# Patient Record
Sex: Female | Born: 1960 | Hispanic: Yes | Marital: Single | Smoking: Current every day smoker
Health system: Southern US, Community
[De-identification: ages and names within clinical notes are randomized; demographics above are authoritative.]

## PROBLEM LIST (undated history)

## (undated) DIAGNOSIS — Z9889 Other specified postprocedural states: Secondary | ICD-10-CM

## (undated) DIAGNOSIS — I10 Essential (primary) hypertension: Secondary | ICD-10-CM

## (undated) HISTORY — PX: BREAST SURGERY: SHX581

## (undated) HISTORY — PX: BREAST CYST EXCISION: SHX579

## (undated) HISTORY — DX: Essential (primary) hypertension: I10

## (undated) HISTORY — DX: Other specified postprocedural states: Z98.890

---

## 2018-12-14 ENCOUNTER — Encounter: Payer: Self-pay | Admitting: *Deleted

## 2018-12-31 ENCOUNTER — Ambulatory Visit (INDEPENDENT_AMBULATORY_CARE_PROVIDER_SITE_OTHER): Payer: Self-pay | Admitting: Obstetrics and Gynecology

## 2018-12-31 ENCOUNTER — Encounter: Payer: Self-pay | Admitting: Obstetrics and Gynecology

## 2018-12-31 VITALS — BP 175/109 | HR 72 | Ht 63.0 in | Wt 177.4 lb

## 2018-12-31 DIAGNOSIS — L299 Pruritus, unspecified: Secondary | ICD-10-CM | POA: Insufficient documentation

## 2018-12-31 DIAGNOSIS — Z113 Encounter for screening for infections with a predominantly sexual mode of transmission: Secondary | ICD-10-CM

## 2018-12-31 DIAGNOSIS — Z01419 Encounter for gynecological examination (general) (routine) without abnormal findings: Secondary | ICD-10-CM

## 2018-12-31 DIAGNOSIS — Z1239 Encounter for other screening for malignant neoplasm of breast: Secondary | ICD-10-CM

## 2018-12-31 DIAGNOSIS — N3020 Other chronic cystitis without hematuria: Secondary | ICD-10-CM

## 2018-12-31 DIAGNOSIS — Z1151 Encounter for screening for human papillomavirus (HPV): Secondary | ICD-10-CM

## 2018-12-31 DIAGNOSIS — R102 Pelvic and perineal pain: Secondary | ICD-10-CM

## 2018-12-31 DIAGNOSIS — Z124 Encounter for screening for malignant neoplasm of cervix: Secondary | ICD-10-CM

## 2018-12-31 LAB — POCT URINALYSIS DIP (DEVICE)
Bilirubin Urine: NEGATIVE
Glucose, UA: NEGATIVE mg/dL
Hgb urine dipstick: NEGATIVE
Ketones, ur: NEGATIVE mg/dL
Leukocytes, UA: NEGATIVE
Nitrite: NEGATIVE
PROTEIN: NEGATIVE mg/dL
Specific Gravity, Urine: 1.025 (ref 1.005–1.030)
Urobilinogen, UA: 0.2 mg/dL (ref 0.0–1.0)
pH: 5.5 (ref 5.0–8.0)

## 2018-12-31 MED ORDER — NITROFURANTOIN MONOHYD MACRO 100 MG PO CAPS: 100.0000 mg | ORAL_CAPSULE | Freq: Two times a day (BID) | ORAL | 0 refills | Status: DC

## 2018-12-31 NOTE — Progress Notes (Signed)
Pt states she feels a crawling sensation on her bottom & having clear d/c. And has been having bad headaches.

## 2018-12-31 NOTE — Addendum Note (Signed)
Addended by: Nettie Elm L on: 12/31/2018 05:00 PM   Modules accepted: Orders

## 2018-12-31 NOTE — Progress Notes (Signed)
Patient ID: Autumn Villanueva, female   DOB: 1961-12-23, 58 y.o.   MRN: [id number] Autumn Villanueva presents with multiple complaints. She reports external itching for the last 1-2 months Has seen PCP and been treated with various medications without relief. Has also been treated for several UTI's. Still reports some lower abd/pelvic cramps She denies any bladder dysfunction but has problems with constipation. Not sexual active. Last pap unknown HTN ( did not take meds today) Last mammogram 1 yr ago, normal per pt TSVD x 2  PE AF BP as noted Lungs clear Heart RRR Abd soft + BS GU nl EGBUS cervix no lesion bladder + tenderness uterus small mobile no masses or tenderness  A/P Yearly Gyn exam        Pelvic pain        Chronic cystitis        Pruritis  Pap smear completed. Screening mammogram ordered. Will check UA and Urine culture. Check GYN U/S. Suspect pt's pain related to bladder, will treat with Macrobid. A & D onit for pruritis. F/U in 6 weeks

## 2019-01-05 ENCOUNTER — Telehealth: Payer: Self-pay

## 2019-01-05 NOTE — Telephone Encounter (Signed)
Called pt using [employer] id# [phone] advise pt of [place] Appt on 1/13 at 11am & to arrive at 10:45 w/ full bladder. No answer.no VM

## 2019-01-06 ENCOUNTER — Telehealth: Payer: Self-pay

## 2019-01-06 LAB — CYTOLOGY - PAP
Adequacy: ABSENT
CHLAMYDIA, DNA PROBE: NEGATIVE
Diagnosis: NEGATIVE
HPV 16/18/45 genotyping: NEGATIVE
HPV: DETECTED — AB
Neisseria Gonorrhea: NEGATIVE

## 2019-01-06 NOTE — Telephone Encounter (Signed)
Called pt again using [address] Jerrel Ivory id# [phone], regarding [place] appt on 01/11/19, no answer,  VM not set up. Will send certified letter of appointment. My Chart not setup

## 2019-01-11 ENCOUNTER — Ambulatory Visit (HOSPITAL_COMMUNITY)
Admission: RE | Admit: 2019-01-11 | Discharge: 2019-01-11 | Disposition: A | Discharge status: Home / Self Care | Payer: Self-pay | Source: Ambulatory Visit | Attending: Obstetrics and Gynecology | Admitting: Obstetrics and Gynecology

## 2019-01-11 ENCOUNTER — Telehealth: Payer: Self-pay

## 2019-01-11 DIAGNOSIS — R102 Pelvic and perineal pain: Secondary | ICD-10-CM | POA: Insufficient documentation

## 2019-01-11 NOTE — Telephone Encounter (Signed)
Called pt to give [place] results & to make sure she keeps her f/u appt on 02/11/19 @ 9:15am. Used Spanish [employer] [place] id# [phone] to relay message. Pt verbalized understanding.

## 2019-01-11 NOTE — Telephone Encounter (Signed)
-----   Message from Hermina Staggers, MD sent at 01/11/2019  3:35 PM EST ----- Please let pt know that her GYN U/S was normal Continue and complete antibiotics Keep F/U appt with me in 6 weeks Thanks Casimiro Needle

## 2019-02-11 ENCOUNTER — Ambulatory Visit (INDEPENDENT_AMBULATORY_CARE_PROVIDER_SITE_OTHER): Payer: Self-pay | Admitting: Obstetrics and Gynecology

## 2019-02-11 ENCOUNTER — Encounter: Payer: Self-pay | Admitting: Obstetrics and Gynecology

## 2019-02-11 VITALS — BP 135/90 | HR 68 | Ht 63.0 in | Wt 180.6 lb

## 2019-02-11 DIAGNOSIS — N760 Acute vaginitis: Secondary | ICD-10-CM

## 2019-02-11 DIAGNOSIS — L299 Pruritus, unspecified: Secondary | ICD-10-CM

## 2019-02-11 MED ORDER — GABAPENTIN 300 MG PO CAPS: 300.0000 mg | ORAL_CAPSULE | Freq: Three times a day (TID) | ORAL | 1 refills | Status: DC

## 2019-02-11 MED ORDER — ESTRADIOL 0.1 MG/GM VA CREA: 2.0000 g | TOPICAL_CREAM | Freq: Every day | VAGINAL | 5 refills | Status: DC

## 2019-02-11 NOTE — Patient Instructions (Signed)
Virus del papiloma humano  Human Papillomavirus  El virus del papiloma humano (VPH) es la infeccin de transmisin sexual (ITS) ms frecuente. Se transmite fcilmente de persona a persona (es contagiosa). El VPH puede causar verrugas genitales y algunos tipos de cncer. Las verrugas genitales pueden verse y sentirse. Adems, es posible tener lesiones similares a las verrugas en la garganta. Es posible que el VPH no presente ningn sntoma. Puede padecer el VPH por mucho tiempo y no saberlo. Puede transmitir VPH a otras personas sin saberlo.  Siga estas indicaciones en su casa:  Medicamentos   Tome los medicamentos de venta libre y los recetados solamente como se lo haya indicado el mdico. Esto incluye cremas para la comezn o la irritacin.   No trate las verrugas genitales con los medicamentos para las verrugas de las manos.  Cmo se evita?   Hable con el mdico sobre las vacunas del VPH. Los hombres y las mujeres entre 9 y 26aos deben vacunarse. Las vacunas no surtirn efecto si ya tiene VPH. Las embarazadas no deben vacunarse.   Despus del tratamiento, use preservativos cuando tenga relaciones sexuales. Esto ayuda en la prevencin de futuras infecciones.   Tenga solo una pareja sexual.   No tenga una pareja sexual que tenga otras parejas sexuales.   Asegrese de realizarse la prueba de Papanicolaou como se lo haya indicado el mdico.  Instrucciones generales   No se toque ni se rasque las verrugas.   No mantenga relaciones sexuales durante el tratamiento.   No se haga duchas vaginales ni use tampones durante el tratamiento.   Hable con sus pareja sexual sobre su infeccin. Es posible que su pareja tambin necesite tratamiento.   Si queda embarazada, informe a su mdico que tiene VPH. El mdico la controlar durante el embarazo.   Concurra a todas las visitas de control como se lo haya indicado el mdico. Esto es importante.  Comunquese con un mdico si:   La piel tratada se enrojece, se hincha  o duele.   Tiene fiebre.   Se siente enfermo.   Palpa bultos o granos en la zona genital o alrededor de esta.   Tiene una hemorragia vaginal.   Tiene hemorragia en la zona que recibi tratamiento.   Siente dolor durante las relaciones sexuales.  Resumen   El virus del papiloma humano (VPH) es la infeccin de transmisin sexual (ITS) ms frecuente. El virus se transmite fcilmente de una persona a otra.   Hable con el mdico sobre las vacunas del VPH. Los hombres y las mujeres entre 9 y 26aos deben vacunarse.   Es posible que el VPH no presente ningn sntoma.   Use cremas para la picazn o la irritacin solamente como se lo haya indicado el mdico.  Esta informacin no tiene como fin reemplazar el consejo del mdico. Asegrese de hacerle al mdico cualquier pregunta que tenga.  Document Released: 01/18/2011 Document Revised: 08/12/2017 Document Reviewed: 08/12/2017  Elsevier Interactive Patient Education  2019 Elsevier Inc.

## 2019-02-11 NOTE — Progress Notes (Signed)
Autumn Villanueva is here for follow up from last office visit. Reports that her pelvic pain has resolved. GYN U/S was unremarkable. Results reviewed with pt.  Pt is still having vaginal itching, irritation and pain with IC. She reports Sx seem to be aggravated with IC with or without condoms. She does have a new sexual partner and current problem started with new partner.  Pap smear from last visit normal except for HPV.  PE AF VSS Lungs clear Heart RRR Abd soft + BS GU nl EGBUS point tenderness vestibular area right > left Bladder non tender uterus small mobile non tender no masses  A/P Pelvic pain and chronic cystitis resolved        Pruritus/Vestibularitis I suspect pt problem is related to HPV and new sexual partner. HPV discussed with pt. Will try Estrace vaginal cream and start Gabapentin. Instructed to reframe from IC for the next 6 weeks F/U in 6 weeks. Live interrupter used during today's visit

## 2019-02-11 NOTE — Progress Notes (Signed)
Pt states still having the Vaginal itching & burning.And Inflamation

## 2019-02-25 ENCOUNTER — Telehealth: Payer: Self-pay | Admitting: Obstetrics and Gynecology

## 2019-02-25 DIAGNOSIS — N760 Acute vaginitis: Secondary | ICD-10-CM

## 2019-02-25 NOTE — Telephone Encounter (Signed)
Received a call from her husband about her Rx. He states she only received one, and should have gotten two. Requesting a call back from nurse with an interpreter for his wife.

## 2019-03-01 NOTE — Telephone Encounter (Signed)
Received a voice mail from 02/26/19 1:23pm from a female stating he called earlier about missing prescription- wants a call back.

## 2019-03-02 MED ORDER — ESTRADIOL 0.1 MG/GM VA CREA: 2.0000 g | TOPICAL_CREAM | Freq: Every day | VAGINAL | 5 refills | Status: DC

## 2019-03-02 NOTE — Addendum Note (Signed)
Addended by: Kathee Delton on: 03/02/2019 09:18 AM   Modules accepted: Orders

## 2019-03-02 NOTE — Telephone Encounter (Signed)
Called patient with Autumn Villanueva for interpreter and patient states she was supposed to have a Rx for a vaginal cream but it isn't at her pharmacy. Per chart review, med wasn't e-scribed. Reordered medication & informed patient. Patient verbalized understanding & states she is out of gabapentin Rx. Informed patient of refills at pharmacy. Patient verbalized understanding & had no other questions.

## 2019-03-04 ENCOUNTER — Ambulatory Visit: Payer: Self-pay | Admitting: Obstetrics and Gynecology

## 2019-03-29 ENCOUNTER — Ambulatory Visit (INDEPENDENT_AMBULATORY_CARE_PROVIDER_SITE_OTHER): Payer: Self-pay | Admitting: Obstetrics and Gynecology

## 2019-03-29 ENCOUNTER — Telehealth: Payer: Self-pay | Admitting: Obstetrics and Gynecology

## 2019-03-29 ENCOUNTER — Other Ambulatory Visit: Payer: Self-pay

## 2019-03-29 ENCOUNTER — Encounter: Payer: Self-pay | Admitting: Obstetrics and Gynecology

## 2019-03-29 DIAGNOSIS — N760 Acute vaginitis: Secondary | ICD-10-CM

## 2019-03-29 DIAGNOSIS — B009 Herpesviral infection, unspecified: Secondary | ICD-10-CM | POA: Insufficient documentation

## 2019-03-29 DIAGNOSIS — L299 Pruritus, unspecified: Secondary | ICD-10-CM

## 2019-03-29 MED ORDER — ESTRADIOL 0.1 MG/GM VA CREA: 2.0000 g | TOPICAL_CREAM | Freq: Every day | VAGINAL | 5 refills | Status: AC

## 2019-03-29 MED ORDER — VALACYCLOVIR HCL 1 G PO TABS: 1000.0000 mg | ORAL_TABLET | Freq: Two times a day (BID) | ORAL | 5 refills | Status: AC

## 2019-03-29 MED ORDER — GABAPENTIN 300 MG PO CAPS: 300.0000 mg | ORAL_CAPSULE | Freq: Three times a day (TID) | ORAL | 2 refills | Status: AC

## 2019-03-29 NOTE — Progress Notes (Signed)
Patient ID: Autumn Villanueva, female   DOB: 1961/09/27, 58 y.o.   MRN: [id number]   TELEHEALTH VIRTUAL GYNECOLOGY VISIT ENCOUNTER NOTE  I connected with Jeri Cos de Valladares on 03/29/19 at  9:15 AM EDT by telephone at home and verified that I am speaking with the correct person using two identifiers.   I discussed the limitations, risks, security and privacy concerns of performing an evaluation and management service by telephone and the availability of in person appointments. I also discussed with the patient that there may be a patient responsible charge related to this service. The patient expressed understanding and agreed to proceed.   History:  Harneet Ascanio de Flossie Dibble is a 58 y.o. [phone] female being evaluated today for follow up. She reports seeing her PCP 2 weeks ago and was Dx with HSV Type 2. She had visible lesions. But was only given 2 days Tx. She stopped the Estrace d/t to external irration. Completed 1 month of Gabapentin and did not refill. She thinks it was helping. Still has itching and feeling irration.      Past Medical History:  Diagnosis Date  . Hypertension    Past Surgical History:  Procedure Laterality Date  . BREAST SURGERY     The following portions of the patient's history were reviewed and updated as appropriate: allergies, current medications, past family history, past medical history, past social history, past surgical history and problem list.   Health Maintenance: NA  Review of Systems:  Pertinent items noted in HPI and remainder of comprehensive ROS otherwise negative.  Physical Exam:  Physical exam deferred due to nature of the encounter  Labs and Imaging No results found for this or any previous visit (from the past 336 hour(s)). No results found.    Assessment and Plan:     1. Pruritus - gabapentin (NEURONTIN) 300 MG capsule; Take 1 capsule (300 mg total) by mouth 3 (three) times daily.  Dispense: 30 capsule; Refill:  2 - estradiol (ESTRACE VAGINAL) 0.1 MG/GM vaginal cream; Place 2 g vaginally daily.  Dispense: 42.5 g; Refill: 5  2. HSV-2 (herpes simplex virus 2) infection  - valACYclovir (VALTREX) 1000 MG tablet; Take 1 tablet (1,000 mg total) by mouth 2 (two) times daily. Take for ten days. Then take 1 tablet qd  Dispense: 20 tablet; Refill: 5  3. Vestibulitis of vagina  - gabapentin (NEURONTIN) 300 MG capsule; Take 1 capsule (300 mg total) by mouth 3 (three) times daily.  Dispense: 30 capsule; Refill: 2 - estradiol (ESTRACE VAGINAL) 0.1 MG/GM vaginal cream; Place 2 g vaginally daily.  Dispense: 42.5 g; Refill: 5       I discussed the assessment and treatment plan with the patient via phone interrupter. Explained that HSV would need to be treated and then to start suppressive treatment. Proper use of Estrace cream ( internal use) reviewed with pt. Will rest plus Gabapentin. Importance of taking medication as directed reviewed. The patient was provided an opportunity to ask questions and all were answered. The patient agreed with the plan and demonstrated an understanding of the instructions.   The patient was advised to call back or seek an in-person evaluation/go to the ED if the symptoms worsen or if the condition fails to improve as anticipated. She will f/u in 3 months  I provided 15 minutes of non-face-to-face time during this encounter.   Hermina Staggers, MD [facility], [facility]

## 2019-03-29 NOTE — Telephone Encounter (Signed)
Called the patient to inform of virtual visit. The patient understands and also stated the mobile line is a good phone number to perform the virtual visit.

## 2019-03-29 NOTE — Progress Notes (Signed)
  Pt states stopped vaginal cream because it was still irritating her. Pt still having irritation & burning. Was told that she has herpes and needs medication/ was having blisters

## 2019-05-12 ENCOUNTER — Other Ambulatory Visit (HOSPITAL_COMMUNITY): Payer: Self-pay | Admitting: *Deleted

## 2019-05-12 DIAGNOSIS — Z1231 Encounter for screening mammogram for malignant neoplasm of breast: Secondary | ICD-10-CM

## 2019-08-19 ENCOUNTER — Other Ambulatory Visit: Payer: Self-pay

## 2019-08-19 ENCOUNTER — Ambulatory Visit
Admission: RE | Admit: 2019-08-19 | Discharge: 2019-08-19 | Disposition: A | Discharge status: Home / Self Care | Payer: No Typology Code available for payment source | Source: Ambulatory Visit | Attending: Obstetrics and Gynecology | Admitting: Obstetrics and Gynecology

## 2019-08-19 ENCOUNTER — Ambulatory Visit (HOSPITAL_COMMUNITY)
Admission: RE | Admit: 2019-08-19 | Discharge: 2019-08-19 | Disposition: A | Discharge status: Home / Self Care | Payer: Self-pay | Source: Ambulatory Visit | Attending: Obstetrics and Gynecology | Admitting: Obstetrics and Gynecology

## 2019-08-19 ENCOUNTER — Encounter (HOSPITAL_COMMUNITY): Payer: Self-pay

## 2019-08-19 DIAGNOSIS — Z1239 Encounter for other screening for malignant neoplasm of breast: Secondary | ICD-10-CM

## 2019-08-19 DIAGNOSIS — Z1231 Encounter for screening mammogram for malignant neoplasm of breast: Secondary | ICD-10-CM

## 2019-08-19 NOTE — Patient Instructions (Signed)
Explained breast self awareness with Autumn Villanueva. Patient did not need a Pap smear today due to last Pap smear and HPV typing was 12/31/2018. Let her know that her next Pap smear is due in January 2020 due to her last Pap smear was HPV positive. Referred patient to the [facility] for a screening mammogram. Appointment scheduled for Thursday, August 19, 2019 at 1120.  Patient aware of appointment and will be there. Let patient know the Breast Center will follow up with her within the next couple weeks with results of mammogram by letter or phone. Discussed smoking cessation with patient. Referred to the [facility] Quitline and gave resources to the free smoking cessation classes at [facility]. Autumn Villanueva verbalized understanding.  [name], Arvil Chaco, RN 11:59 AM

## 2019-08-19 NOTE — Progress Notes (Signed)
No complaints today.   Pap Smear: Pap smear not completed today. Last Pap smear was 12/31/2018 at [facility] for [place] and normal with positive HPV. Per patient has no history of an abnormal Pap smear. Last Pap smear result is in Epic.  Physical exam: Breasts Right breast slightly larger than the left breast that per patient is normal for her. No skin abnormalities bilateral breasts. No nipple retraction bilateral breasts. No nipple discharge bilateral breasts. No lymphadenopathy. No lumps palpated bilateral breasts. Complaints of right outer breast mild tenderness on exam. Referred patient to the [facility] for a screening mammogram. Appointment scheduled for Thursday, August 19, 2019 at 1120.        Pelvic/Bimanual No Pap smear completed today since last Pap smear and HPV typing was 12/31/2018. Pap smear not indicated per BCCCP guidelines.   Smoking History: Patient is a current smoker. Discussed smoking cessation with patient. Referred to the [facility] Quitline and gave resources to the free smoking cessation classes at [facility].  Patient Navigation: Patient education provided. Access to services provided for patient through [facility] program. Spanish interpreter provided.   Colorectal Cancer Screening: Per patient has never had a colonoscopy completed. No complaints today.   Breast and Cervical Cancer Risk Assessment: Patient has no family history of breast cancer, known genetic mutations, or radiation treatment to the chest before age 68. Patient has no history of cervical dysplasia, immunocompromised, or DES exposure in-utero.  Risk Assessment    Risk Scores      08/19/2019   Last edited by: Armond Hang, LPN   5-year risk: 0.8 %   Lifetime risk: 5.3 %         Used Spanish interpreter Rudene Anda from [place].

## 2019-08-23 ENCOUNTER — Other Ambulatory Visit: Payer: Self-pay | Admitting: Obstetrics and Gynecology

## 2019-08-23 DIAGNOSIS — R928 Other abnormal and inconclusive findings on diagnostic imaging of breast: Secondary | ICD-10-CM

## 2019-08-24 ENCOUNTER — Encounter (HOSPITAL_COMMUNITY): Payer: Self-pay | Admitting: *Deleted

## 2019-08-25 ENCOUNTER — Other Ambulatory Visit: Payer: No Typology Code available for payment source

## 2019-08-30 ENCOUNTER — Telehealth (HOSPITAL_COMMUNITY): Payer: Self-pay | Admitting: *Deleted

## 2019-08-30 NOTE — Telephone Encounter (Signed)
Called patient with Spanish interpreter Benjamine Sprague. Patient did not show for her follow-up diagnostic mammogram at the [facility] 08/25/2019. Reminded patient to reschedule appointment. Patient given phone number to the Breast Center to reschedule and let her know BCCCP will cover. Patient verbalized understanding.

## 2019-09-07 ENCOUNTER — Other Ambulatory Visit: Payer: Self-pay | Admitting: Obstetrics and Gynecology

## 2019-09-07 ENCOUNTER — Ambulatory Visit
Admission: RE | Admit: 2019-09-07 | Discharge: 2019-09-07 | Disposition: A | Discharge status: Home / Self Care | Payer: No Typology Code available for payment source | Source: Ambulatory Visit | Attending: Obstetrics and Gynecology | Admitting: Obstetrics and Gynecology

## 2019-09-07 ENCOUNTER — Other Ambulatory Visit: Payer: Self-pay

## 2019-09-07 DIAGNOSIS — N6320 Unspecified lump in the left breast, unspecified quadrant: Secondary | ICD-10-CM

## 2019-09-07 DIAGNOSIS — R928 Other abnormal and inconclusive findings on diagnostic imaging of breast: Secondary | ICD-10-CM

## 2020-02-12 ENCOUNTER — Ambulatory Visit: Payer: No Typology Code available for payment source | Attending: Internal Medicine

## 2020-02-12 DIAGNOSIS — Z23 Encounter for immunization: Secondary | ICD-10-CM

## 2020-02-12 NOTE — Progress Notes (Signed)
   Covid-19 Vaccination Clinic  Name:  Lenni Reckner    MRN: [id number] DOB: Apr 05, 1961  02/12/2020  Ms. Ascanio de Valladares was observed post Covid-19 immunization for 15 minutes without incidence. She was provided with Vaccine Information Sheet and instruction to access the V-Safe system.   Ms. casie sturgeon was instructed to call 911 with any severe reactions post vaccine: Marland Kitchen Difficulty breathing  . Swelling of your face and throat  . A fast heartbeat  . A bad rash all over your body  . Dizziness and weakness    Immunizations Administered    Name Date Dose VIS Date Route   Pfizer COVID-19 Vaccine 02/12/2020  3:18 PM 0.3 mL 12/10/2019 Intramuscular   Manufacturer: [employer], [employer]   Lot: [id number]   NDC: [address]-1

## 2020-03-06 ENCOUNTER — Ambulatory Visit
Admission: RE | Admit: 2020-03-06 | Discharge: 2020-03-06 | Disposition: A | Discharge status: Home / Self Care | Payer: No Typology Code available for payment source | Source: Ambulatory Visit | Attending: Obstetrics and Gynecology | Admitting: Obstetrics and Gynecology

## 2020-03-06 ENCOUNTER — Other Ambulatory Visit: Payer: Self-pay | Admitting: Obstetrics and Gynecology

## 2020-03-06 ENCOUNTER — Ambulatory Visit: Payer: Self-pay | Attending: Internal Medicine

## 2020-03-06 ENCOUNTER — Other Ambulatory Visit: Payer: Self-pay

## 2020-03-06 DIAGNOSIS — N6320 Unspecified lump in the left breast, unspecified quadrant: Secondary | ICD-10-CM

## 2020-03-06 DIAGNOSIS — Z23 Encounter for immunization: Secondary | ICD-10-CM | POA: Insufficient documentation

## 2020-03-06 NOTE — Progress Notes (Signed)
   Covid-19 Vaccination Clinic  Name:  Betsaida [place]    MRN: [id number] DOB: 08-Jan-1961  03/06/2020  Autumn Villanueva was observed post Covid-19 immunization for 15 minutes without incident. She was provided with Vaccine Information Sheet and instruction to access the V-Safe system.   Ms. naoko diperna was instructed to call 911 with any severe reactions post vaccine: Marland Kitchen Difficulty breathing  . Swelling of face and throat  . A fast heartbeat  . A bad rash all over body  . Dizziness and weakness   Immunizations Administered    Name Date Dose VIS Date Route   Pfizer COVID-19 Vaccine 03/06/2020  4:28 PM 0.3 mL 12/10/2019 Intramuscular   Manufacturer: [employer], [employer]   Lot: [id number]   NDC: [address]-8

## 2020-06-05 ENCOUNTER — Other Ambulatory Visit: Payer: Self-pay

## 2020-06-05 ENCOUNTER — Other Ambulatory Visit: Payer: Self-pay | Admitting: *Deleted

## 2020-06-05 DIAGNOSIS — Z124 Encounter for screening for malignant neoplasm of cervix: Secondary | ICD-10-CM

## 2020-06-05 NOTE — Progress Notes (Signed)
Patient: Autumn Villanueva de Flossie Dibble           Date of Birth: 1961/09/13           MRN: [id number] Visit Date: 06/05/2020 PCP: Sandre Kitty, PA-C  Cervical Cancer Screening Do you smoke?: No Have you ever had or been told you have an allergy to latex products?: No Marital status: Single Date of last pap smear: Within the last year Date of last menstrual period: (Postmenopausal) Number of pregnancies: 3 Number of births: 2 Have you ever had any of the following? Hysterectomy: Yes Tubal ligation (tubes tied): No Abnormal bleeding: No Abnormal pap smear: Yes(12/31/2018- Positive HPV ([facility] [employer] [facility]) ) Venereal warts: Don't Know(2019: +HS[facility]- [facility] ) A sex partner with venereal warts: No A high risk* sex partner: No  Cervical Exam  Abnormal Observations: Sore observed right lower labia near vaginal opening. Patient stated she has taken Valtrex in the past with no improvement.  Recommendations: Last Pap smear was 12/31/2018 at [facility] and normal with positive HPV. Per patient has no history of an abnormal Pap smear. Last Pap smear result is in Epic. Next Pap smear will be due based on the result of today's Pap smear.    Used Spanish interpreter [employer] from [place].  Patient's History Patient Active Problem List   Diagnosis Date Noted  . Screening breast examination 08/19/2019  . HSV-2 (herpes simplex virus 2) infection 03/29/2019  . Vestibulitis of vagina 02/11/2019  . Pruritus 12/31/2018   Past Medical History:  Diagnosis Date  . Hypertension     Family History  Problem Relation Age of Onset  . Diabetes Mother     Social History   Occupational History  . Not on file  Tobacco Use  . Smoking status: Current Every Day Smoker  . Smokeless tobacco: Never Used  Substance and Sexual Activity  . Alcohol use: Not Currently  . Drug use: Not Currently  . Sexual activity: Yes    Birth  control/protection: None

## 2020-06-09 LAB — CYTOLOGY - PAP
Comment: NEGATIVE
Comment: NEGATIVE
Diagnosis: NEGATIVE
Diagnosis: REACTIVE
HPV 16: NEGATIVE
HPV 18 / 45: NEGATIVE
High risk HPV: POSITIVE — AB

## 2020-06-16 ENCOUNTER — Telehealth: Payer: Self-pay

## 2020-06-16 NOTE — Telephone Encounter (Signed)
(  06/15/2020) Via Tarri Fuller, Spanish Interpreter, Patient was informed per Dr. Delfin Gant, pap smear negative, HPV is positive, negative for HPV 16, HPV 18/45, repeat co-testing (pap smear) within 1 year. Patient verbalized understanding.

## 2020-08-21 ENCOUNTER — Other Ambulatory Visit: Payer: Self-pay

## 2020-08-22 ENCOUNTER — Other Ambulatory Visit: Payer: Self-pay

## 2020-08-22 ENCOUNTER — Ambulatory Visit: Payer: No Typology Code available for payment source | Admitting: *Deleted

## 2020-08-22 ENCOUNTER — Ambulatory Visit
Admission: RE | Admit: 2020-08-22 | Discharge: 2020-08-22 | Disposition: A | Discharge status: Home / Self Care | Payer: No Typology Code available for payment source | Source: Ambulatory Visit | Attending: Obstetrics and Gynecology | Admitting: Obstetrics and Gynecology

## 2020-08-22 VITALS — BP 126/80 | Temp 97.8°F | Wt 166.4 lb

## 2020-08-22 DIAGNOSIS — N6320 Unspecified lump in the left breast, unspecified quadrant: Secondary | ICD-10-CM

## 2020-08-22 DIAGNOSIS — Z1239 Encounter for other screening for malignant neoplasm of breast: Secondary | ICD-10-CM

## 2020-08-22 DIAGNOSIS — Z0000 Encounter for general adult medical examination without abnormal findings: Secondary | ICD-10-CM

## 2020-08-22 NOTE — Progress Notes (Addendum)
Ms. Jaiden Wahab de Flossie Dibble is a 59 y.o. female who presents to [facility] clinic today with no complaints.    Pap Smear: Pap not smear completed today. Last Pap smear was 06/05/2020 at the free cervical cancer screening clinic at the [facility]' and was normal with positive HPV. Reflex HPV testing for 16 and 18 was negative. Per patient has no history of an abnormal Pap smear. Last Pap smear result is available in Epic.   Physical exam: Breasts Right breast slightly larger than the left breast that per patient is normal for her. No skin abnormalities bilateral breasts. No nipple retraction bilateral breasts. No nipple discharge bilateral breasts. No lymphadenopathy. No lumps palpated bilateral breasts. Complaints of bilateral outer breast tenderness on exam.   Pelvic/Bimanual Pap is not indicated today per BCCCP guidelines.    Smoking History: Patient is a current smoker. Discussed smoking cessation patient. Referred to the [facility] Quitline and gave resources to the free smoking cessation classes at [facility].   Patient Navigation: Patient education provided. Access to services provided for patient through [facility] program. Spanish interpreter Natale Lay from [facility] provided.    Colorectal Cancer Screening: Per patient has never had colonoscopy completed. No complaints today. FIT Test given to patient to complete and return to BCCCP.   Breast and Cervical Cancer Risk Assessment: Patient does not have family history of breast cancer, known genetic mutations, or radiation treatment to the chest before age 52. Patient does not have history of cervical dysplasia, immunocompromised, or DES exposure in-utero.  Risk Assessment    Risk Scores      08/22/2020 08/19/2019   Last edited by: Meryl Dare, CMA Stoney Bang H, LPN   5-year risk: 0.9 % 0.8 %   Lifetime risk: 5.2 % 5.3 %          A: BCCCP exam without pap smear Patient referred to [facility] by the [facility] Center  of [facility] due to recommending a bilateral diagnostic mammogram and Ultrasound in August 2021 that would demonstrate complete one year follow-up.  P: Referred patient to the [facility] of [facility] for a diagnostic mammogram. Appointment scheduled Tuesday, August 22, 2020 at 1040.  Priscille Heidelberg, RN 08/22/2020 10:22 AM

## 2020-08-22 NOTE — Patient Instructions (Signed)
Explained breast self awareness with Autumn Villanueva. Patient did not need a Pap smear today due to last Pap smear and HPV typing was 06/05/2020. Let patient know that next Pap smear is due in one year due to last Pap smear was positive for HPV. Referred patient to the [facility] of [facility] for a diagnostic mammogram. Appointment scheduled Tuesday, August 22, 2020 at 1040. Patient aware of appointment and will be there. Discussed smoking cessation patient. Referred to the [facility] Quitline and gave resources to the free smoking cessation classes at [facility]. Autumn Villanueva verbalized understanding.  Tandrea Kommer, Kathaleen Maser, RN 10:22 AM

## 2020-09-06 ENCOUNTER — Other Ambulatory Visit: Payer: Self-pay

## 2020-09-06 ENCOUNTER — Inpatient Hospital Stay: Payer: Self-pay | Attending: Obstetrics and Gynecology | Admitting: *Deleted

## 2020-09-06 ENCOUNTER — Other Ambulatory Visit: Payer: Self-pay | Admitting: Obstetrics and Gynecology

## 2020-09-06 VITALS — BP 118/80 | Ht 61.0 in | Wt 163.6 lb

## 2020-09-06 DIAGNOSIS — Z0000 Encounter for general adult medical examination without abnormal findings: Secondary | ICD-10-CM

## 2020-09-06 NOTE — Progress Notes (Signed)
[facility] initial screening   Interpreter- Natale Lay, Haroldine Laws   Clinical Measurement:  Vitals:   09/06/20 0820  BP: 112/70   Fasting Labs Drawn Today, will review with patient when they result.   Medical History:  Patient states that she does not have high cholesterol, has high blood pressure and she does not have diabetes.  Medications:  Patient states that she does take medication to lower blood pressure. Patient does not take medication to lower cholesterol or blood sugar. Patient does not take an aspirin a day to help prevent a heart attack or stroke. During the past 7 days patient has taken prescribed medication to lower blood pressure on all 7 days.   Blood pressure, self measurement:  :  Patient states that she does measure blood pressure from home. She checks her blood pressure monthly. She shares her readings with a health care provider: no.   Nutrition: Patient states that on average she eats 1 cups of fruit and 1 cups of vegetables per day. Patient states that she does not eat fish at least 2 times per week. Patient eats less than half servings of whole grains. Patient drinks less than 36 ounces of beverages with added sugar weekly: yes. Patient is currently watching sodium or salt intake: yes. In the past 7 days patient has had 0 drinks containing alcohol. On average patient drinks 0 drinks containing alcohol per day.      Physical activity:  Patient states that she gets 0 minutes of moderate and 0 minutes of vigorous physical activity each week.  Smoking status:  Patient states that she has is a current smoker .   Quality of life:  Over the past 2 weeks patient states that she had little interest or pleasure in doing things: not at all. She has been feeling down, depressed or hopeless:not at all.    Risk reduction and counseling:   Health Coaching: Explained to patient about the daily recommendations for fruits and vegetables. Informed patient that the recommendation is for  2 cups of fruit and 3 cups of vegetables per day. Spoke about the importance of heart healthy fish and whole grains in a heart healthy diet. Gave recommendations for different fish and whole grains that she can try to incorporate into diet. (salmon, tuna, mackerel or sardines) (oatmeal, whole grain cereals, whole wheat bread, brown rice and whole wheat pasta. Encouraged patient to continue watching salt intake due to history of hypertension. Also spoke with patient about trying to add in daily exercise, with a goal of 20 minutes per day.    Patient declined referral to the [facility]. Patient has been thinking about quitting but is not ready to do so at this time. Instructed patient to call me if she changes her mind about the referral.   Navigation:  I will notify patient of lab results.  Patient is aware of 2 more health coaching sessions and a follow up.  Time: 20 minutes

## 2020-09-06 NOTE — Addendum Note (Signed)
Addended by: Meryl Dare on: 09/06/2020 08:34 AM   Modules accepted: Orders

## 2020-09-07 LAB — LIPID PANEL
Chol/HDL Ratio: 3.3 ratio (ref 0.0–4.4)
Cholesterol, Total: 153 mg/dL (ref 100–199)
HDL: 47 mg/dL (ref >39)
LDL Chol Calc (NIH): 83 mg/dL (ref 0–99)
Triglycerides: 129 mg/dL (ref 0–149)
VLDL Cholesterol Cal: 23 mg/dL (ref 5–40)

## 2020-09-07 LAB — HEMOGLOBIN A1C
Est. average glucose Bld gHb Est-mCnc: 117 mg/dL
Hgb A1c MFr Bld: 5.7 % — ABNORMAL HIGH (ref 4.8–5.6)

## 2020-09-07 LAB — GLUCOSE, RANDOM: Glucose: 95 mg/dL (ref 65–99)

## 2020-09-11 ENCOUNTER — Telehealth: Payer: Self-pay

## 2020-09-11 NOTE — Telephone Encounter (Signed)
Health coaching 2   interpreter- Natale Lay, [employer]   Labs- 153 cholesterol, 83 LDL cholesterol, 129 triglycerides, 47 HDL cholesterol , 5.7 hemoglobin A1C, 95 mean plasma glucose. Patient understands and is aware of her lab results.   Goals-  Discussed lab results with patient and answered any questions patient had regarding results. Encouraged patient to start watching the amount of sweets and sugars that she consumes in both foods and drinks as well as carbohydrates. Explained to patient that the recommendation is for 6 oz of grains a day. Gave examples to patient of what 1 oz would look like with different grains/carbs. Finally encouraged patient to start exercising, with a goal of 20 minutes a day.   Navigation:  Patient is aware of 1 more health coaching sessions and a follow up. Will call patient with follow-up appointment information for Internal Medicine for elevated hemoglobin A1C once appointed is scheduled.  Time- 15 minutes

## 2020-09-14 ENCOUNTER — Telehealth: Payer: Self-pay

## 2020-09-14 NOTE — Telephone Encounter (Signed)
Called patient via Natale Lay to give FU appt. Information with Internal Medicine. Patient is scheduled for Monday, September 27 @ 10:15 am.

## 2020-09-25 ENCOUNTER — Encounter: Payer: No Typology Code available for payment source | Admitting: Student

## 2021-03-21 IMAGING — MG MM DIGITAL DIAGNOSTIC UNILAT*L* W/ TOMO W/ CAD
4 series · 4 of 12 positions shown · non-contrast
Comparison: August 19, 2019 and September 07, 2019

CLINICAL DATA: 58-year-old patient presents for six-month follow-up
of a probably benign mass in the 11 o'clock position of the left
breast.

EXAM:
DIGITAL DIAGNOSTIC LEFT MAMMOGRAM WITH CAD AND TOMO
ULTRASOUND LEFT BREAST

[L MLO synth-2D]
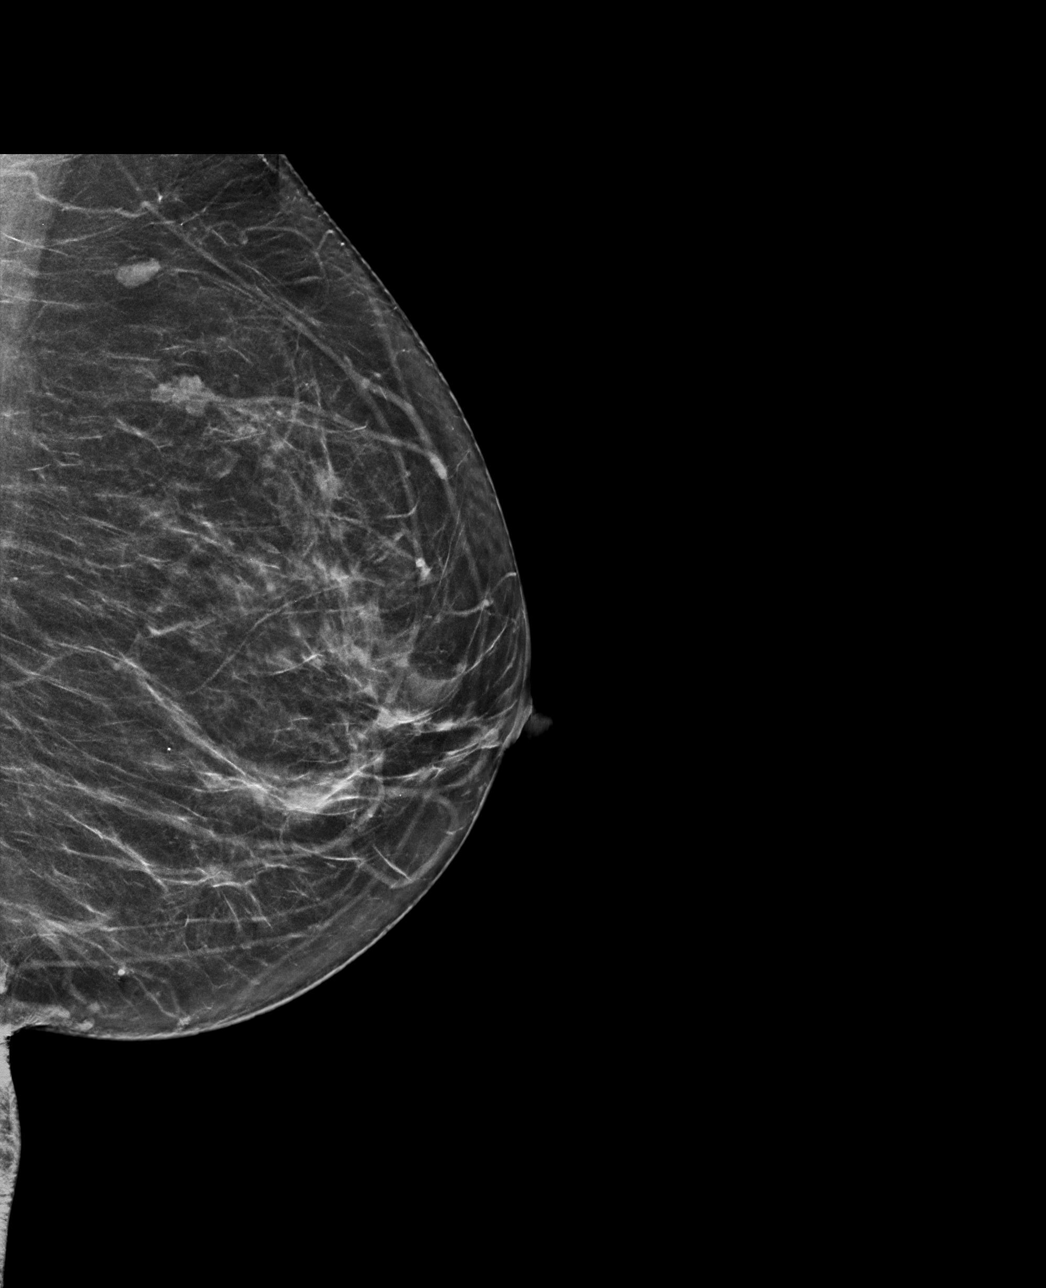

[L CC synth-2D]
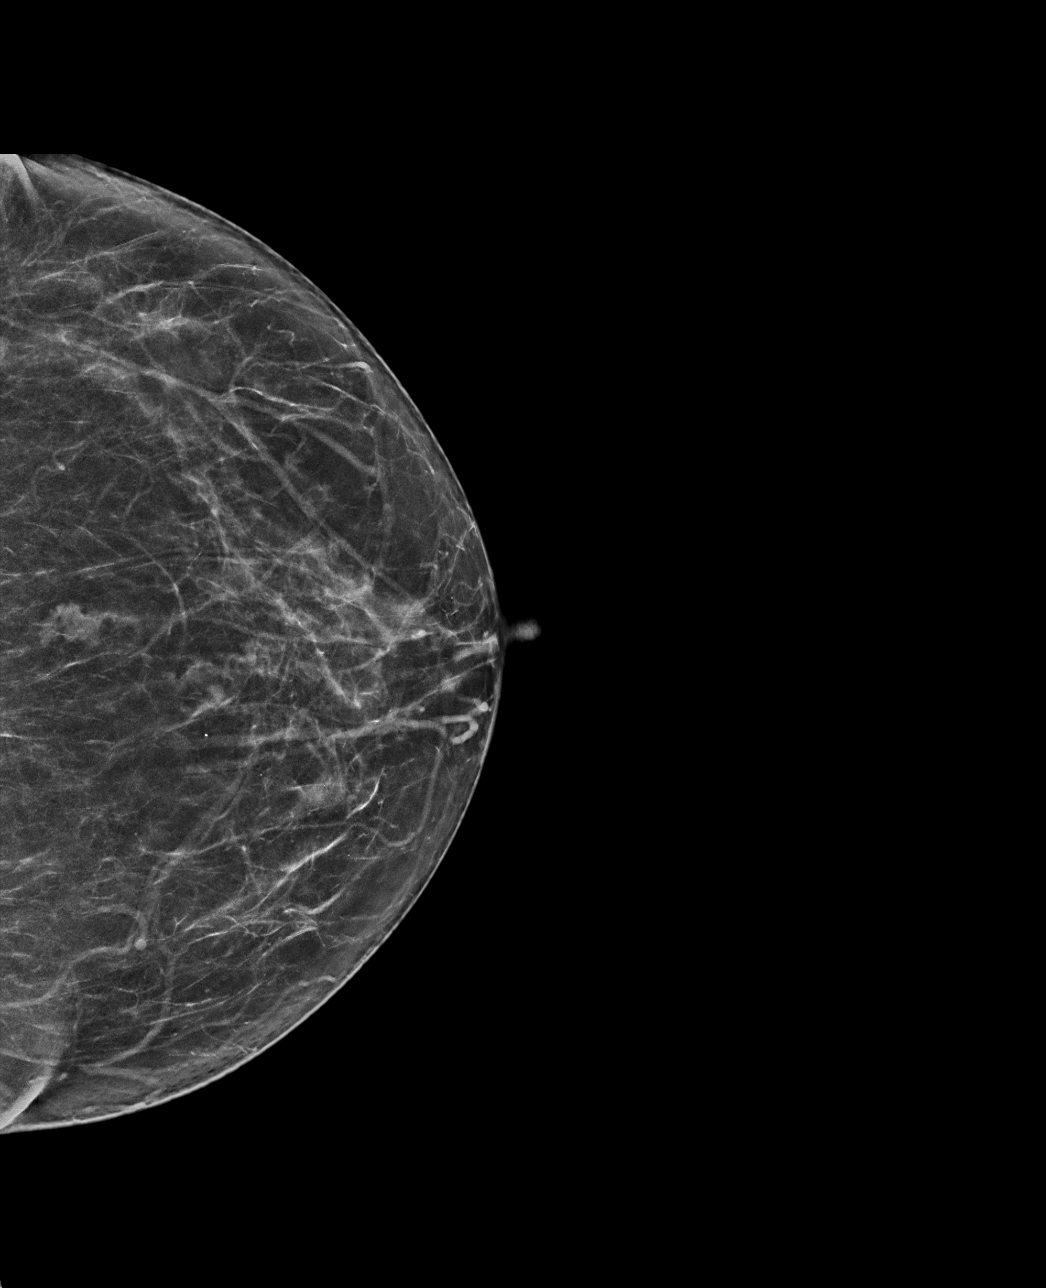

[L CC tomo · tomo slice 33/66.0]
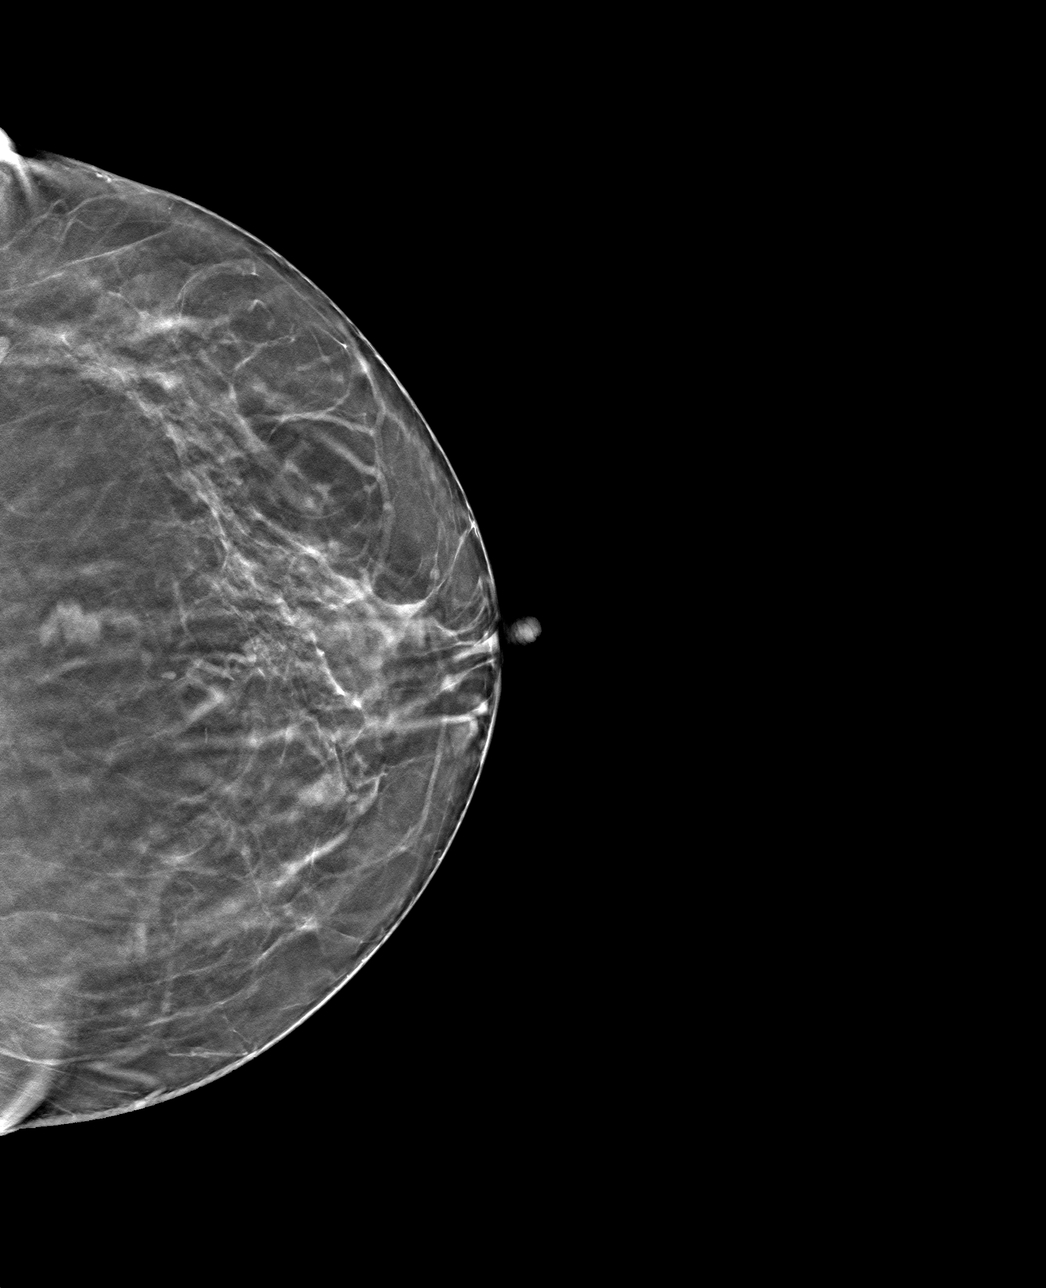

[L MLO tomo · tomo slice 35/69.0]
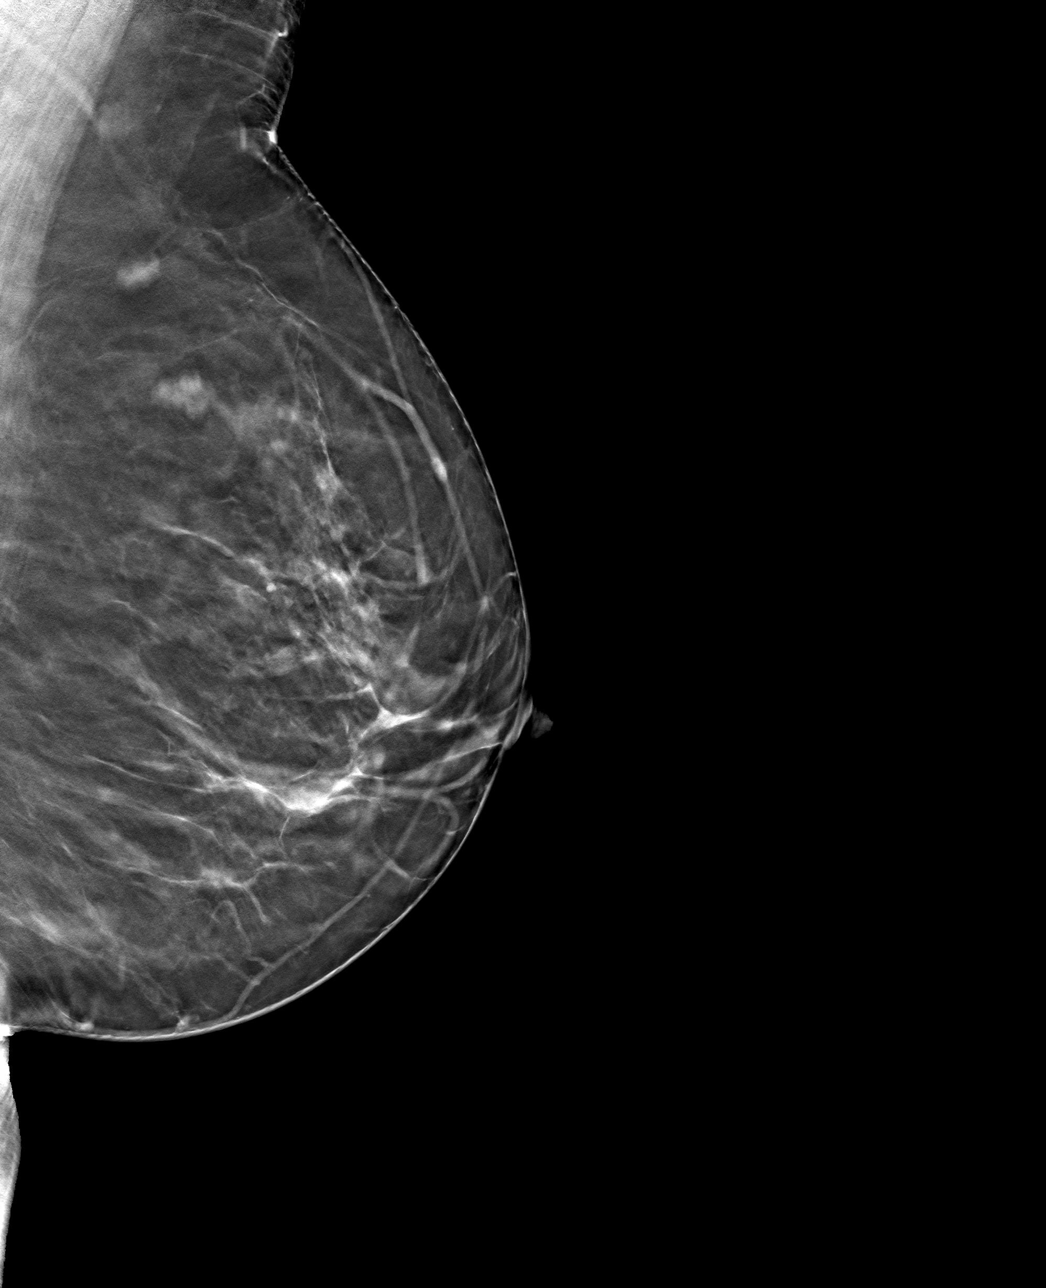

[4 of 12 positions shown; findings below may reference images not displayed]

ACR Breast Density Category b: There are scattered areas of
fibroglandular density.
FINDINGS: Lobulated oval mass in the upper central left breast is
mammographically stable. No new or suspicious mass, architectural
distortion, or suspicious microcalcification is identified.

Mammographic images were processed with CAD.

Targeted ultrasound is performed, showing a circumscribed cluster of
cyst with internal thin echogenic septations at 11 o'clock position
7 cm from nipple. This mass measures 1.2 x 0.5 x 1.0 cm. No internal
vascular flow is identified. In August 2019 this mass measured
1.2 x 0.4 x 0.9 cm. No significant interval change in the
sonographic appearance of the mass.
IMPRESSION: Stable probably benign left breast mass 11 o'clock position. This is
likely a cluster of benign cysts.

RECOMMENDATION:
Bilateral diagnostic mammogram and possible left breast ultrasound
is recommended in July 2020 to complete a 1 year follow-up.

I have discussed the findings and recommendations with the patient.
If applicable, a reminder letter will be sent to the patient
regarding the next appointment.

BI-RADS CATEGORY  3: Probably benign.

## 2021-07-09 ENCOUNTER — Telehealth: Payer: Self-pay

## 2021-07-09 NOTE — Telephone Encounter (Signed)
Left message for patient about completing [facility] 3 for the [facility] Woman program via [place]. Left name and number for patient to call back.

## 2021-09-06 IMAGING — US US BREAST*L* LIMITED INC AXILLA
1 series · 6 of 6 positions shown · non-contrast
Comparison: Previous exam(s).

CLINICAL DATA: 59-year-old female presenting for 1 year follow-up
of a probably benign left breast mass.

EXAM:
DIGITAL DIAGNOSTIC BILATERAL MAMMOGRAM WITH CAD AND TOMO
ULTRASOUND LEFT BREAST

[Series 1: us breast*left* limited inc axilla · 0.06mm/px · 6 of 6 slices shown]
[im 1/6]
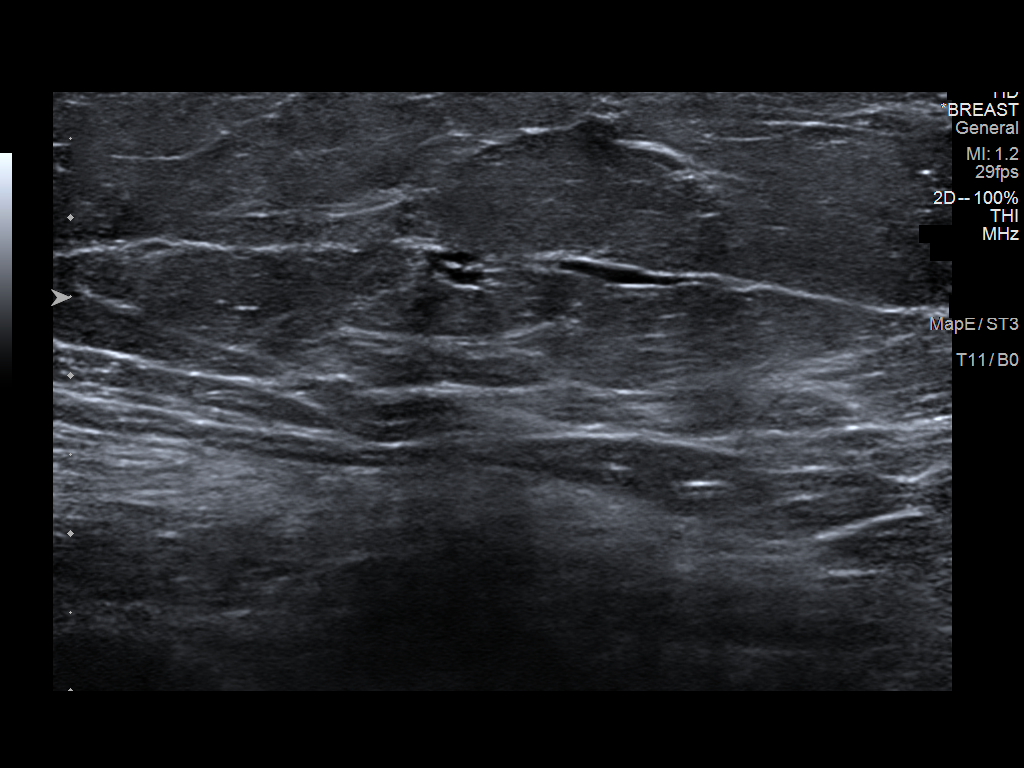
[im 2/6]
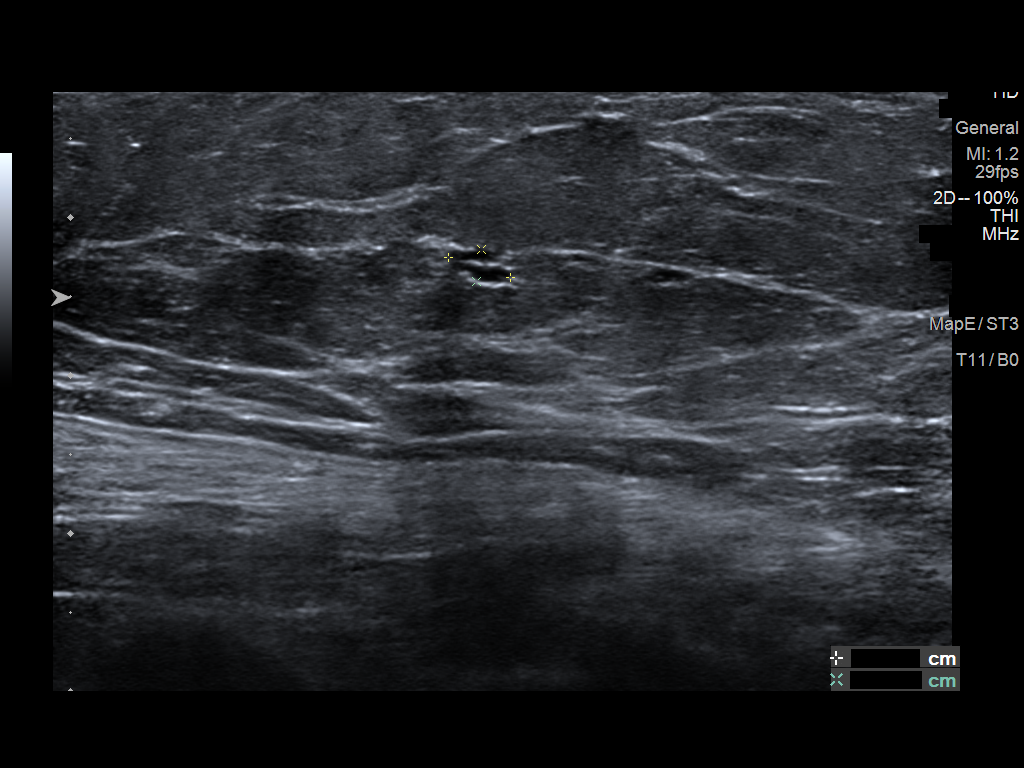
[im 3/6]
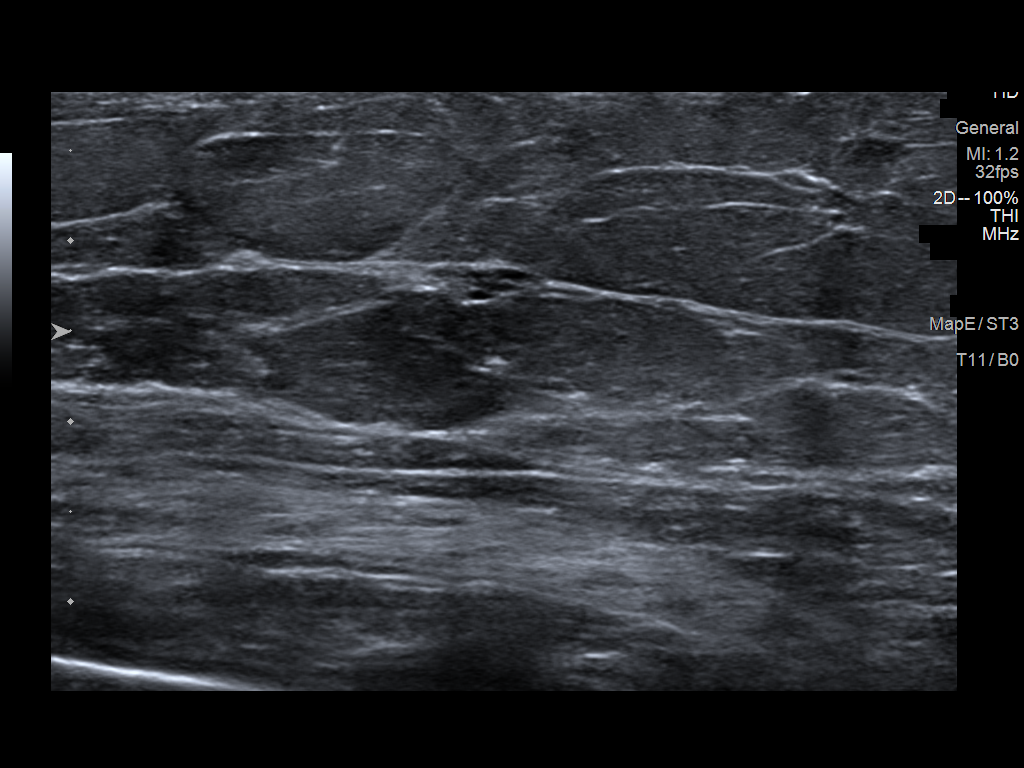
[im 4/6]
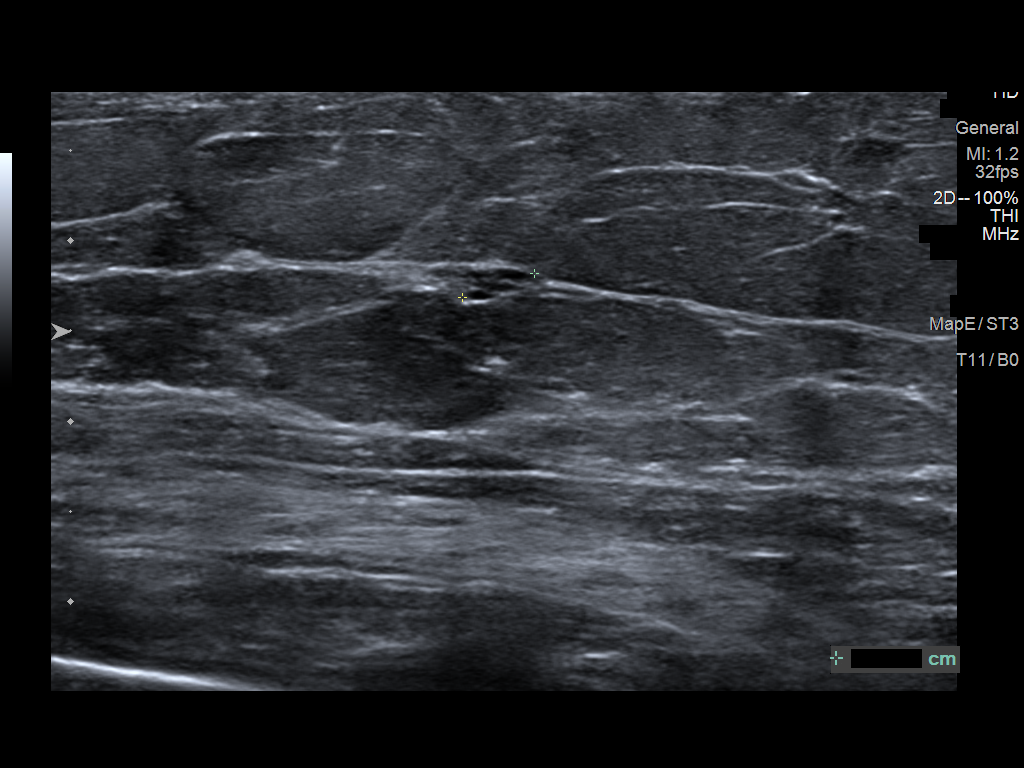
[im 5/6]
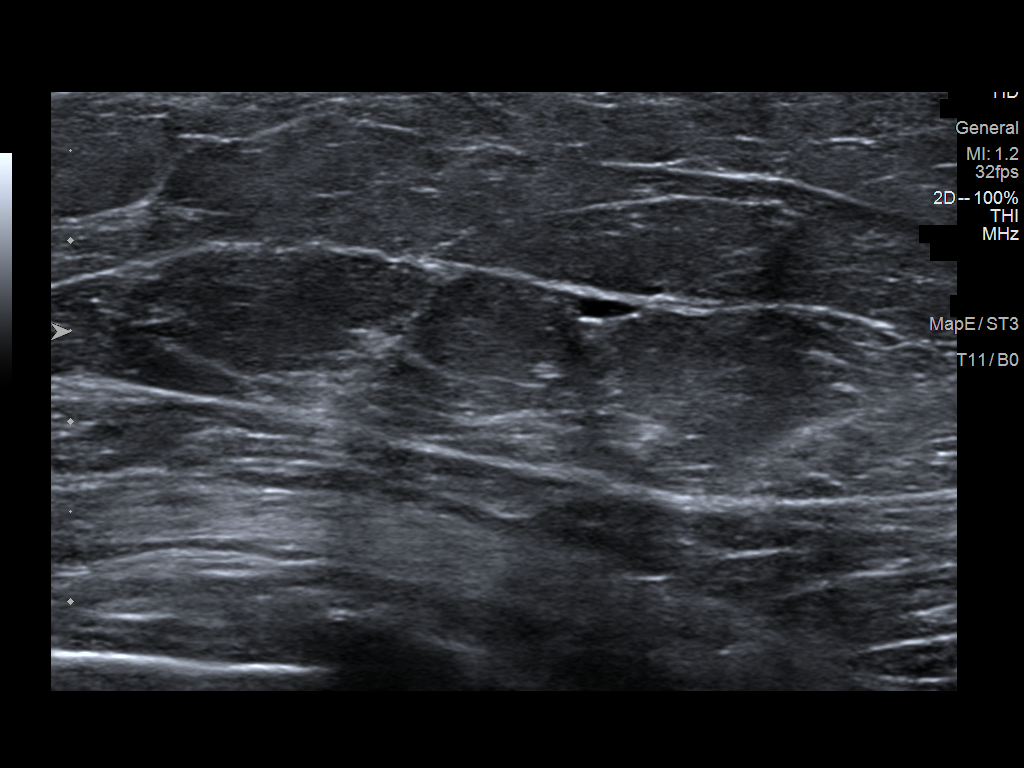
[im 6/6]
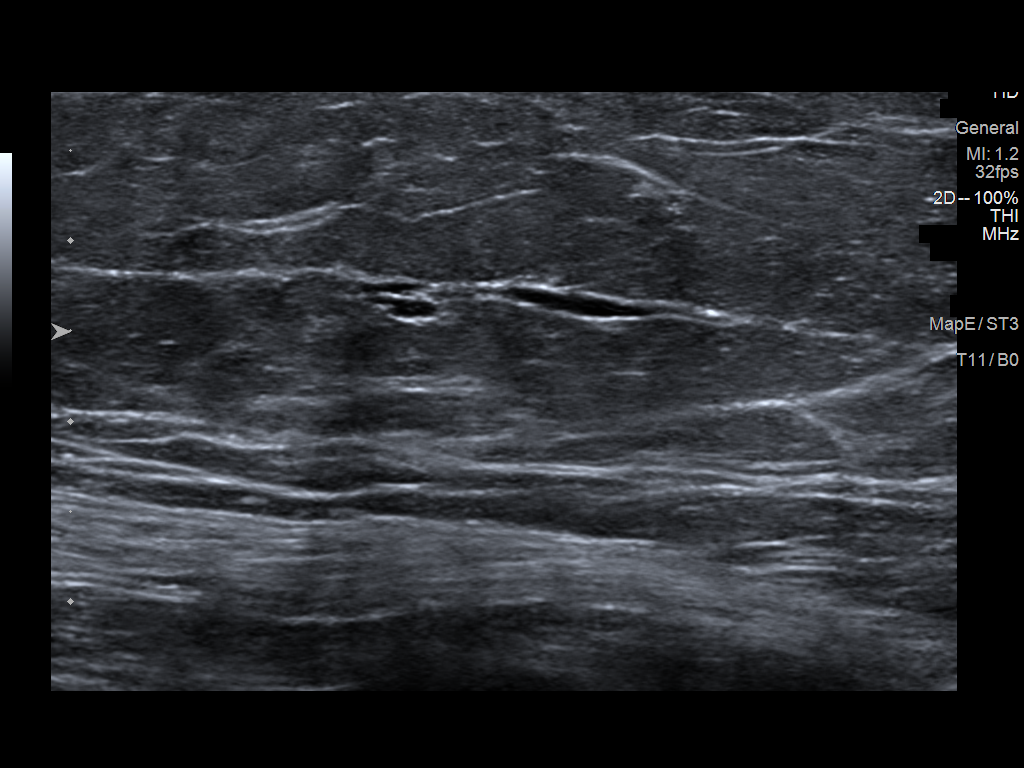

[6 of 6 positions shown; findings below may reference images not displayed]

ACR Breast Density Category b: There are scattered areas of
fibroglandular density.
FINDINGS: Mammogram:

Right breast: No suspicious mass, distortion, or microcalcifications
are identified to suggest presence of malignancy.

Left breast: The previously seen mass in the upper central left
breast is significantly decreased in size compared to prior. There
are no new suspicious findings in the left breast.

Mammographic images were processed with CAD.

Ultrasound:

Targeted ultrasound is performed in the left breast at 11 o'clock 7
cm from the nipple demonstrating a small cluster of cysts measuring
0.4 x 0.2 x 0.4 cm, previously measuring 1.2 x 0.5 x 1.0 cm.
IMPRESSION: Interval near resolution of a left breast mass at 11 o'clock most
consistent with a benign cluster of cysts. No mammographic evidence
of malignancy bilaterally.

RECOMMENDATION:
Screening mammogram in one year.(Code:[id number])

I have discussed the findings and recommendations with the patient.
If applicable, a reminder letter will be sent to the patient
regarding the next appointment.

BI-RADS CATEGORY  2: Benign.
